# Patient Record
Sex: Female | Born: 1975 | Hispanic: No | Marital: Married | Smoking: Never smoker
Health system: Southern US, Community
[De-identification: ages and names within clinical notes are randomized; demographics above are authoritative.]

## PROBLEM LIST (undated history)

## (undated) DIAGNOSIS — I10 Essential (primary) hypertension: Secondary | ICD-10-CM

## (undated) DIAGNOSIS — K219 Gastro-esophageal reflux disease without esophagitis: Secondary | ICD-10-CM

---

## 2009-08-13 ENCOUNTER — Ambulatory Visit (HOSPITAL_COMMUNITY): Admission: RE | Admit: 2009-08-13 | Discharge: 2009-08-13 | Payer: Self-pay | Admitting: Obstetrics and Gynecology

## 2009-08-23 ENCOUNTER — Ambulatory Visit (HOSPITAL_COMMUNITY): Admission: RE | Admit: 2009-08-23 | Discharge: 2009-08-23 | Payer: Self-pay | Admitting: Obstetrics and Gynecology

## 2011-03-27 IMAGING — US US OB DETAIL+14 WK
1 series · 14 of 28 positions shown · non-contrast
Comparison: none

OBSTETRICAL ULTRASOUND:
 This ultrasound was performed in The [HOSPITAL], and the [facility] report will be stored to [REDACTED] PACS.  This report is also available in [HOSPITAL]?s accessANYware.

[Series 1: us ob detail+14 wk · 14 of 110 slices shown]
[im 5/110]
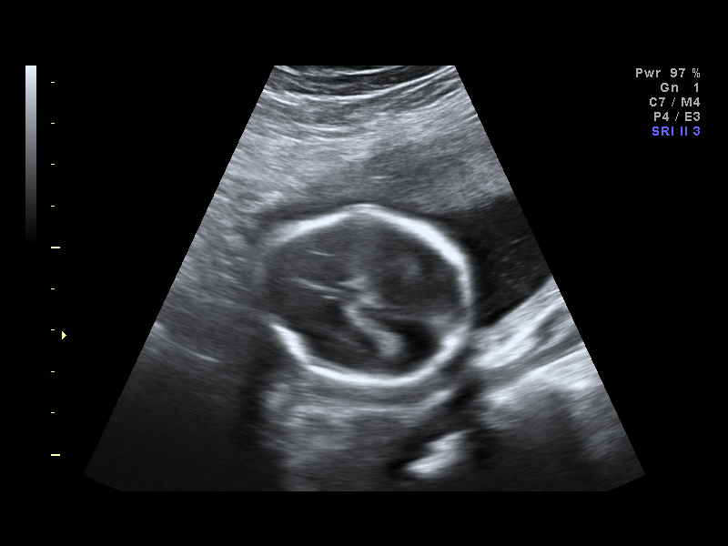
[im 13/110]
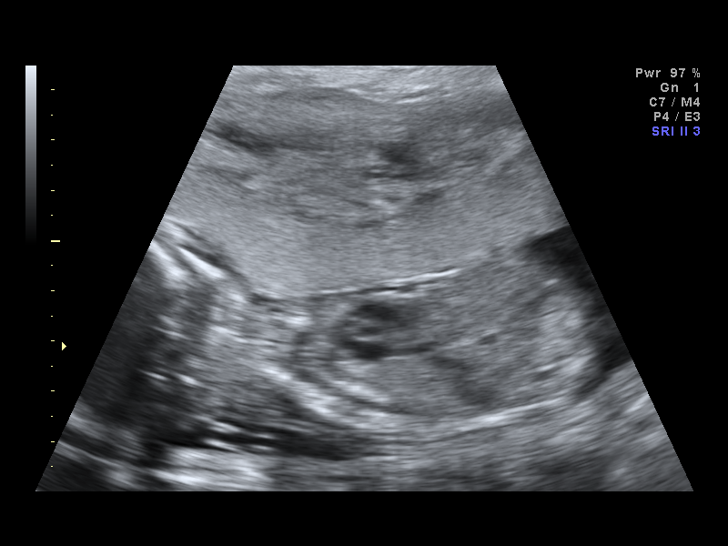
[im 21/110]
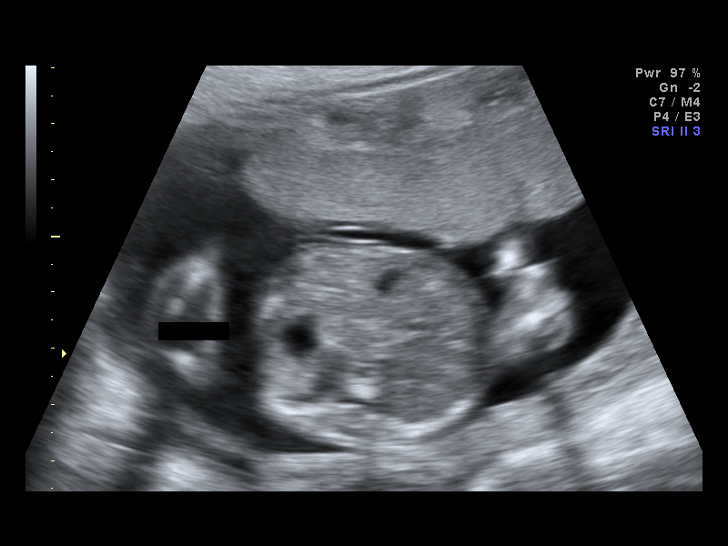
[im 29/110]
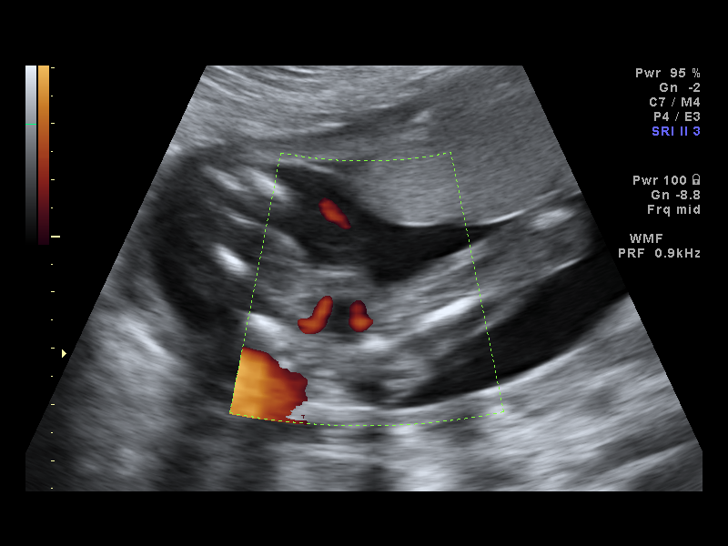
[im 37/110]
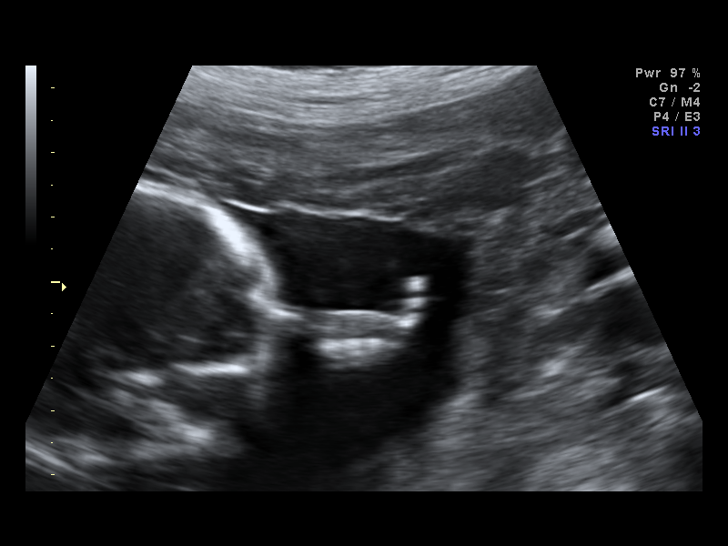
[im 45/110]
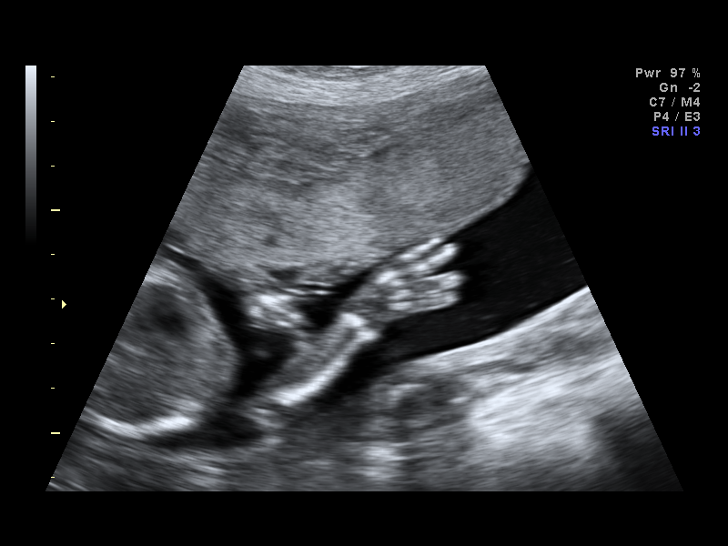
[im 53/110]
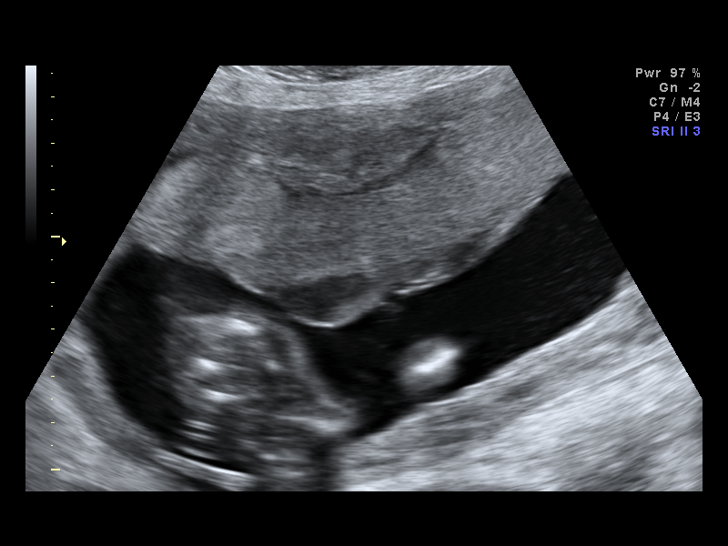
[im 61/110]
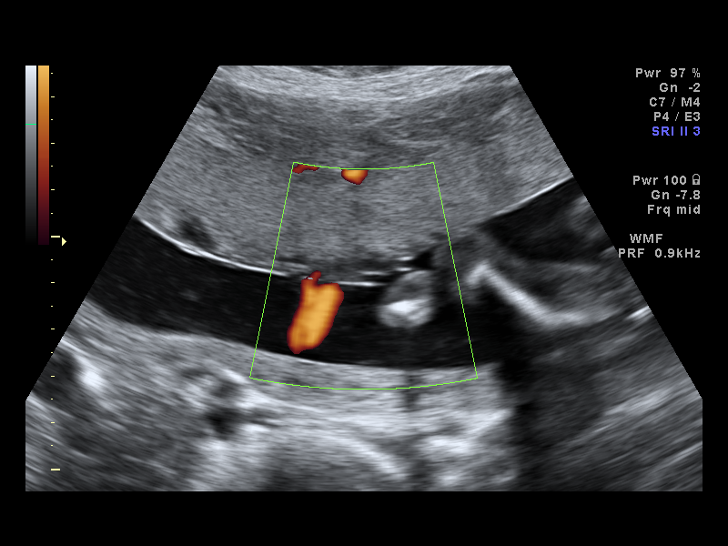
[im 69/110]
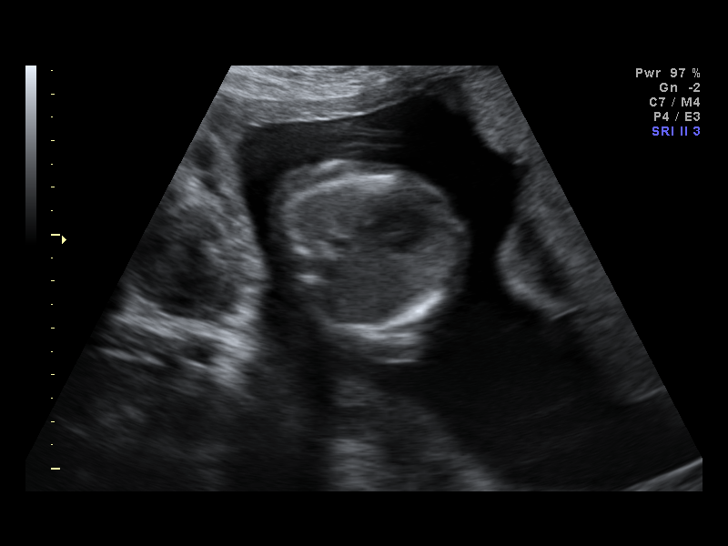
[im 77/110]
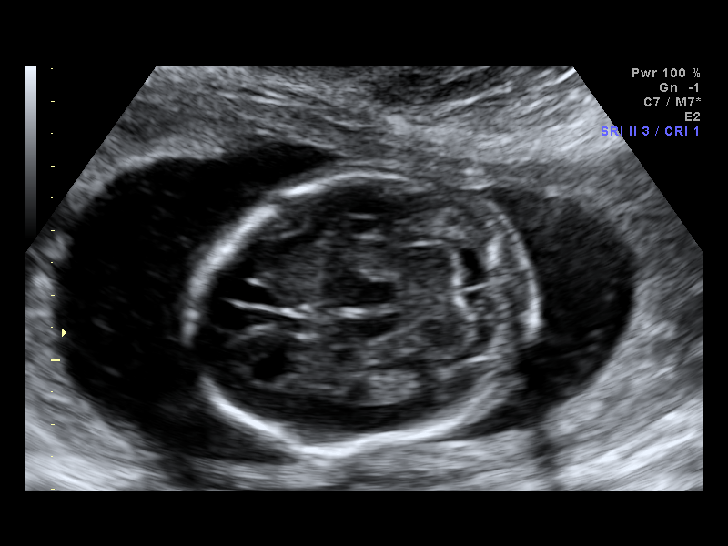
[im 85/110]
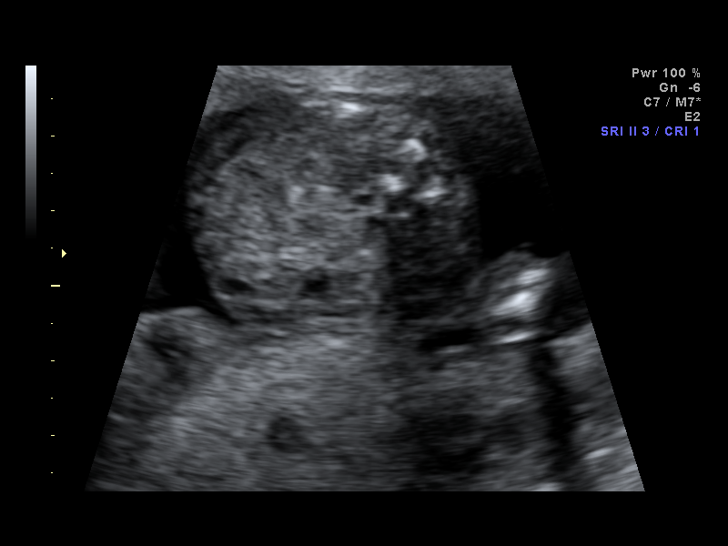
[im 93/110]
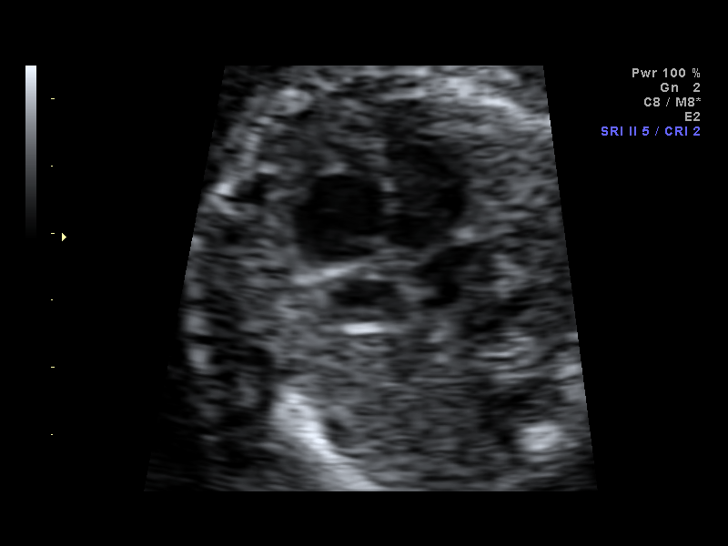
[im 101/110]
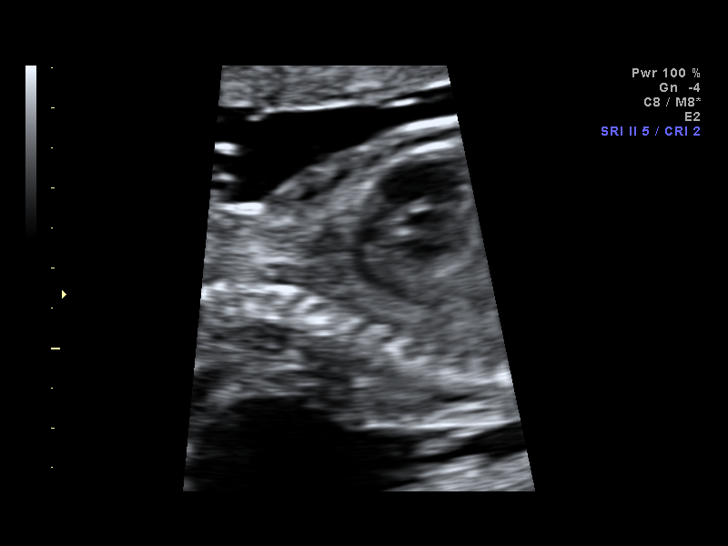
[im 110/110]
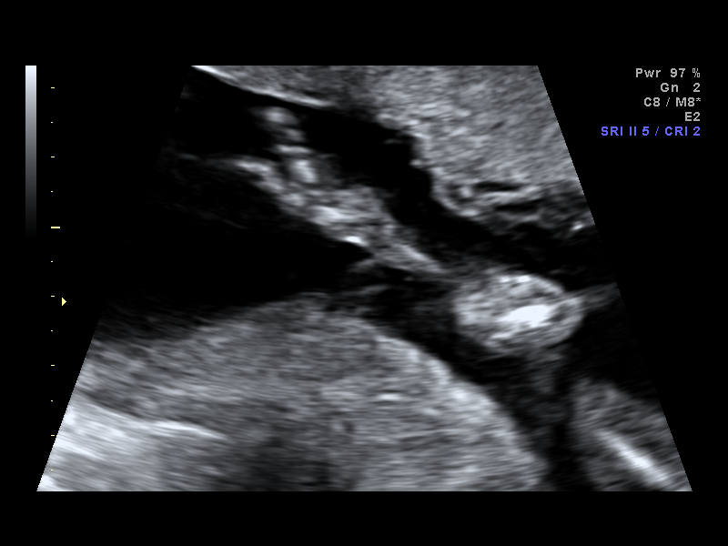

[14 of 28 positions shown; findings below may reference images not displayed]

IMPRESSION: [facility] has also been faxed to the ordering physician.

## 2019-11-11 ENCOUNTER — Emergency Department (HOSPITAL_BASED_OUTPATIENT_CLINIC_OR_DEPARTMENT_OTHER)
Admission: EM | Admit: 2019-11-11 | Discharge: 2019-11-11 | Disposition: A | Discharge status: Home / Self Care | Payer: BLUE CROSS/BLUE SHIELD | Attending: Emergency Medicine | Admitting: Emergency Medicine

## 2019-11-11 ENCOUNTER — Other Ambulatory Visit: Payer: Self-pay

## 2019-11-11 ENCOUNTER — Encounter (HOSPITAL_BASED_OUTPATIENT_CLINIC_OR_DEPARTMENT_OTHER): Payer: Self-pay | Admitting: *Deleted

## 2019-11-11 DIAGNOSIS — Z79899 Other long term (current) drug therapy: Secondary | ICD-10-CM | POA: Insufficient documentation

## 2019-11-11 DIAGNOSIS — I10 Essential (primary) hypertension: Secondary | ICD-10-CM | POA: Insufficient documentation

## 2019-11-11 DIAGNOSIS — R519 Headache, unspecified: Secondary | ICD-10-CM | POA: Diagnosis present

## 2019-11-11 HISTORY — DX: Essential (primary) hypertension: I10

## 2019-11-11 HISTORY — DX: Gastro-esophageal reflux disease without esophagitis: K21.9

## 2019-11-11 LAB — CBC WITH DIFFERENTIAL/PLATELET
Abs Immature Granulocytes: 0.03 10*3/uL (ref 0.00–0.07)
Basophils Absolute: 0 10*3/uL (ref 0.0–0.1)
Basophils Relative: 0 %
Eosinophils Absolute: 0.2 10*3/uL (ref 0.0–0.5)
Eosinophils Relative: 2 %
HCT: 42.5 % (ref 36.0–46.0)
Hemoglobin: 14.2 g/dL (ref 12.0–15.0)
Immature Granulocytes: 0 %
Lymphocytes Relative: 21 %
Lymphs Abs: 2 10*3/uL (ref 0.7–4.0)
MCH: 28.5 pg (ref 26.0–34.0)
MCHC: 33.4 g/dL (ref 30.0–36.0)
MCV: 85.2 fL (ref 80.0–100.0)
Monocytes Absolute: 0.6 10*3/uL (ref 0.1–1.0)
Monocytes Relative: 6 %
Neutro Abs: 6.6 10*3/uL (ref 1.7–7.7)
Neutrophils Relative %: 71 %
Platelets: 214 10*3/uL (ref 150–400)
RBC: 4.99 MIL/uL (ref 3.87–5.11)
RDW: 13.1 % (ref 11.5–15.5)
WBC: 9.4 10*3/uL (ref 4.0–10.5)
nRBC: 0 % (ref 0.0–0.2)

## 2019-11-11 LAB — BASIC METABOLIC PANEL
Anion gap: 9 (ref 5–15)
BUN: 11 mg/dL (ref 6–20)
CO2: 26 mmol/L (ref 22–32)
Calcium: 8.9 mg/dL (ref 8.9–10.3)
Chloride: 105 mmol/L (ref 98–111)
Creatinine, Ser: 0.82 mg/dL (ref 0.44–1.00)
GFR calc Af Amer: >60 mL/min (ref >60)
GFR calc non Af Amer: >60 mL/min (ref >60)
Glucose, Bld: 93 mg/dL (ref 70–99)
Potassium: 3.9 mmol/L (ref 3.5–5.1)
Sodium: 140 mmol/L (ref 135–145)

## 2019-11-11 LAB — CBG MONITORING, ED: Glucose-Capillary: 91 mg/dL (ref 70–99)

## 2019-11-11 MED ORDER — PROCHLORPERAZINE EDISYLATE 10 MG/2ML IJ SOLN
10.0000 mg | Freq: Once | INTRAMUSCULAR | Status: AC
  Administered 2019-11-11: 22:00:00 10 mg via INTRAVENOUS
  Filled 2019-11-11: qty 2

## 2019-11-11 MED ORDER — DIPHENHYDRAMINE HCL 50 MG/ML IJ SOLN
25.0000 mg | Freq: Once | INTRAMUSCULAR | Status: AC
  Administered 2019-11-11: 22:00:00 25 mg via INTRAVENOUS
  Filled 2019-11-11: qty 1

## 2019-11-11 NOTE — ED Triage Notes (Addendum)
Daughter states the family is fasting. Her mother had abdominal pain with one episode of vomiting followed by a headache. Symptoms yesterday am. Only headache today.

## 2019-11-11 NOTE — ED Provider Notes (Signed)
[facility] Provider Note   CSN: [id number] Arrival date & time: 11/11/19  1909     History Chief Complaint  Patient presents with  . Headache    Valerie Kemp is a 44 y.o. female.  HPI 44 year old female with history of hypertension presents to the ER for a 2-day history of headache and 1 episode of nonbloody nonbilious vomiting.  History provided largely by daughter who is at bedside, translating for the patient.  Daughter states that the family has been fasting.  Last night the patient had some abdominal pain and one episode of vomiting, and then proceeded to have a headache that steadily increased in severity throughout the day which is what brought her to the ER.  She endorses the pain is a 13/10.  She denies vision changes, facial pain, sensitivity to light, neck pain, stiffness, weakness.  Her daughter states that she did have some dizziness yesterday but no falls or syncope.  She states that she has a history of headaches but does not take any medication for headaches.  She is not on blood thinners.  No recent falls or head injuries.  She has not taken any medications for her headache.  She denies chest pain, shortness of breath, back pain, fevers, chills.     Past Medical History:  Diagnosis Date  . GERD (gastroesophageal reflux disease)   . Hypertension     There are no problems to display for this patient.   History reviewed. No pertinent surgical history.   OB History   No obstetric history on file.     No family history on file.  Social History   Tobacco Use  . Smoking status: Never Smoker  . Smokeless tobacco: Never Used  Substance Use Topics  . Alcohol use: Never  . Drug use: Never    Home Medications Prior to Admission medications   Medication Sig Start Date End Date Taking? Authorizing Provider  losartan (COZAAR) 50 MG tablet Take by mouth. 12/02/15  Yes [provider]  metoprolol tartrate (LOPRESSOR) 50 MG  tablet Take by mouth. 08/12/19  Yes [provider]  pantoprazole (PROTONIX) 40 MG tablet Take by mouth. 09/09/19  Yes [provider]    Allergies    Patient has no known allergies.  Review of Systems   Review of Systems  Constitutional: Positive for appetite change. Negative for activity change, chills and fever.  HENT: Negative for ear pain, sore throat and trouble swallowing.   Eyes: Negative for photophobia, pain and visual disturbance.  Respiratory: Negative for cough and shortness of breath.   Cardiovascular: Negative for chest pain and palpitations.  Gastrointestinal: Positive for abdominal pain, nausea and vomiting. Negative for abdominal distention, constipation, diarrhea and rectal pain.  Genitourinary: Negative for dysuria, flank pain, hematuria and urgency.  Musculoskeletal: Negative for arthralgias and back pain.  Skin: Negative for color change and rash.  Allergic/Immunologic: Negative for immunocompromised state.  Neurological: Positive for dizziness and headaches. Negative for seizures and syncope.  Psychiatric/Behavioral: Negative for confusion.  All other systems reviewed and are negative.   Physical Exam Updated Vital Signs BP (!) 179/93 (BP Location: Right Arm)   Pulse (!) 59   Temp 98.6 F (37 C) (Oral)   Resp 16   Ht 5\' 1"  (1.549 m)   Wt 81.6 kg   SpO2 98%   BMI 34.01 kg/m   Physical Exam Vitals and nursing note reviewed.  Constitutional:      General: She is not  in acute distress.    Appearance: She is well-developed. She is not ill-appearing, toxic-appearing or diaphoretic.  HENT:     Head: Normocephalic and atraumatic.     Mouth/Throat:     Mouth: Mucous membranes are moist.     Pharynx: Oropharynx is clear.  Eyes:     Extraocular Movements: Extraocular movements intact.     Right eye: Normal extraocular motion.     Left eye: Normal extraocular motion.     Conjunctiva/sclera: Conjunctivae normal.     Pupils: Pupils are equal,  round, and reactive to light.     Right eye: Pupil is round and reactive.     Left eye: Pupil is round and reactive.  Neck:     Meningeal: Brudzinski's sign and Kernig's sign absent.  Cardiovascular:     Rate and Rhythm: Normal rate and regular rhythm.     Heart sounds: Normal heart sounds. No murmur.  Pulmonary:     Effort: Pulmonary effort is normal. No respiratory distress.     Breath sounds: Normal breath sounds.  Abdominal:     Palpations: Abdomen is soft.     Tenderness: There is no abdominal tenderness.  Musculoskeletal:        General: No swelling or tenderness. Normal range of motion.     Cervical back: Neck supple. No rigidity.  Skin:    General: Skin is warm and dry.     Findings: No erythema or rash.  Neurological:     Mental Status: She is alert and oriented to person, place, and time.     GCS: GCS eye subscore is 4. GCS verbal subscore is 5. GCS motor subscore is 6.     Sensory: No sensory deficit.     Motor: No weakness.     Coordination: Romberg sign negative. Coordination normal.     Gait: Gait normal.     Deep Tendon Reflexes: Reflexes normal.     Comments: Mental Status:  Alert, thought content appropriate, able to give a coherent history. Speech fluent without evidence of aphasia. Able to follow 2 step commands without difficulty.  Cranial Nerves:  II: Peripheral visual fields grossly normal, pupils equal, round, reactive to light III,IV, VI: ptosis not present, extra-ocular motions intact bilaterally  V,VII: smile symmetric, facial light touch sensation equal VIII: hearing grossly normal to voice  X: uvula elevates symmetrically  XI: bilateral shoulder shrug symmetric and strong XII: midline tongue extension without fassiculations Motor:  Normal tone. 5/5 strength of BUE and BLE major muscle groups including strong and equal grip strength and dorsiflexion/plantar flexion Sensory: light touch normal in all extremities. Cerebellar: normal finger-to-nose  with bilateral upper extremities, Romberg sign absent Gait: not assessed  Psychiatric:        Mood and Affect: Mood normal.        Speech: Speech normal.        Behavior: Behavior normal.     ED Results / Procedures / Treatments   Labs (all labs ordered are listed, but only abnormal results are displayed) Labs Reviewed  CBC WITH DIFFERENTIAL/PLATELET  BASIC METABOLIC PANEL  CBG MONITORING, ED    EKG None  Radiology No results found.  Procedures Procedures (including critical care time)  Medications Ordered in ED Medications  prochlorperazine (COMPAZINE) injection 10 mg (10 mg Intravenous Given 11/11/19 2212)  diphenhydrAMINE (BENADRYL) injection 25 mg (25 mg Intravenous Given 11/11/19 2212)    ED Course  I have reviewed the triage vital signs and the nursing notes.  Pertinent labs & imaging results that were available during my care of the patient were reviewed by me and considered in my medical decision making (see chart for details).    MDM Rules/Calculators/A&P                     44 year old female with headache x2 days. On presentation to the ER, patient is alert and oriented, appears to be in pain, holding her head, but is nontoxic-appearing, and in no acute distress.  Patient is hypertensive in the ED with her diastolic in the 170s, no previous vitals to compare to but this seems to be consistent with her history of hypertension.  No evidence of hypertensive urgency/emergency.  She is afebrile.  Physical exam without acute abnormalities, normal neuro exam, no abdominal tenderness, negative Kernig and Brudzinski's.   Emergent considerations for headache include subarachnoid hemorrhage, meningitis, temporal arteritis, glaucoma, cerebral ischemia, carotid/vertebral dissection, intracranial tumor, Venous sinus thrombosis, carbon monoxide poisoning, acute or chronic subdural hemorrhage.  Other considerations include: Migraine, Cluster headache, Hypertension, Caffeine,  alcohol, or drug withdrawal, Pseudotumor cerebri, Arteriovenous malformation, Head injury, Neurocysticercosis, Post-lumbar puncture, Preeclampsia, Tension headache, Sinusitis, Cervical arthritis, Refractive error causing strain, Dental abscess, Otitis media, Temporomandibular joint syndrome, Depression, Somatoform disorder (eg, somatization) Trigeminal neuralgia, Glossopharyngeal neuralgia.  BMP without significant electrode abnormalities, normal glucose, no evidence of renal dysfunction.  CBC without leukocytosis, normal hemoglobin.  CBG normal.  Patient states that although this is the worst headache she has ever had, she does state that her headache was gradual and did not have a thunderclap onset quality to it.  She does not have any visual deficits, though she does endorse an episode of dizziness.  Concern for meningitis low as patient is afebrile, with no meningeal signs.  Patient does not endorse any neck pain, she is not on any blood thinners, I do not think that a CT/CTA/lumbar puncture to evaluate for acute intracranial hemorrhage is indicated at this time.  Patient treated with migraine cocktail of Compazine and Benadryl and noted significant improvement in her symptoms.  Given no neuro findings, improvement with treatment, I do not think any additional work-up is indicated at this time.  Abdomen soft and nontender.  Encouraged patient to take over-the-counter anti-inflammatories for headache.  Headache could be secondary to fasting, possible dehydration although there was no evidence of this on her lab work.  Strict return precautions given.  Encourage close follow-up with her PCP.  Patient was seen and evaluated by Dr. Dalene Seltzer and she is agreeable with the above plan.  At this stage in ED course, the patient has been adequately screened and is stable for discharge.  Final Clinical Impression(s) / ED Diagnoses Final diagnoses:  Acute nonintractable headache, unspecified headache type    Rx /  DC Orders ED Discharge Orders    None       Mare Ferrari, PA-C 11/12/19 0040    Alvira Monday, MD 11/12/19 [phone]

## 2019-11-11 NOTE — ED Notes (Signed)
EDP at bedside
